# Patient Record
Sex: Male | Born: 1985 | Race: White | Hispanic: No | Marital: Married | Smoking: Current every day smoker
Health system: Southern US, Community
[De-identification: ages and names within clinical notes are randomized; demographics above are authoritative.]

## PROBLEM LIST (undated history)

## (undated) DIAGNOSIS — G510 Bell's palsy: Secondary | ICD-10-CM

## (undated) HISTORY — PX: TONSILLECTOMY: SUR1361

## (undated) HISTORY — PX: PROSTATE SURGERY: SHX751

## (undated) HISTORY — PX: HAND SURGERY: SHX662

## (undated) HISTORY — DX: Bell's palsy: G51.0

---

## 2005-05-12 ENCOUNTER — Emergency Department: Payer: Self-pay | Admitting: Emergency Medicine

## 2005-11-07 ENCOUNTER — Emergency Department (HOSPITAL_COMMUNITY): Admission: EM | Admit: 2005-11-07 | Discharge: 2005-11-08 | Payer: Self-pay | Admitting: Emergency Medicine

## 2006-01-03 ENCOUNTER — Emergency Department (HOSPITAL_COMMUNITY): Admission: EM | Admit: 2006-01-03 | Discharge: 2006-01-03 | Payer: Self-pay | Admitting: Emergency Medicine

## 2010-07-18 ENCOUNTER — Emergency Department (HOSPITAL_COMMUNITY): Admission: EM | Admit: 2010-07-18 | Discharge: 2010-07-19 | Payer: Self-pay | Admitting: Emergency Medicine

## 2010-11-09 ENCOUNTER — Emergency Department (HOSPITAL_COMMUNITY)
Admission: EM | Admit: 2010-11-09 | Discharge: 2010-11-10 | Payer: Self-pay | Source: Home / Self Care | Admitting: Emergency Medicine

## 2010-11-13 LAB — COMPREHENSIVE METABOLIC PANEL
Alkaline Phosphatase: 106 U/L (ref 39–117)
BUN: 10 mg/dL (ref 6–23)
CO2: 27 mEq/L (ref 19–32)
Calcium: 8.9 mg/dL (ref 8.4–10.5)
GFR calc non Af Amer: >60 mL/min (ref >60)
Glucose, Bld: 106 mg/dL — ABNORMAL HIGH (ref 70–99)
Total Protein: 7.2 g/dL (ref 6.0–8.3)

## 2010-11-13 LAB — DIFFERENTIAL
Basophils Absolute: 0 10*3/uL (ref 0.0–0.1)
Basophils Relative: 0 % (ref 0–1)
Eosinophils Absolute: 0.3 10*3/uL (ref 0.0–0.7)
Eosinophils Relative: 4 % (ref 0–5)
Lymphs Abs: 3.3 10*3/uL (ref 0.7–4.0)
Neutrophils Relative %: 43 % (ref 43–77)

## 2010-11-13 LAB — CBC
Platelets: 285 10*3/uL (ref 150–400)
RBC: 4.94 MIL/uL (ref 4.22–5.81)
RDW: 13.6 % (ref 11.5–15.5)
WBC: 7.7 10*3/uL (ref 4.0–10.5)

## 2010-11-13 LAB — POCT CARDIAC MARKERS
CKMB, poc: <1 ng/mL — ABNORMAL LOW (ref 1.0–8.0)
Myoglobin, poc: 40.5 ng/mL (ref 12–200)
Troponin i, poc: <0.05 ng/mL (ref 0.00–0.09)

## 2010-11-13 LAB — LIPASE, BLOOD: Lipase: 25 U/L (ref 11–59)

## 2012-04-07 ENCOUNTER — Emergency Department: Payer: Self-pay | Admitting: Emergency Medicine

## 2012-04-10 ENCOUNTER — Emergency Department: Payer: Self-pay | Admitting: Emergency Medicine

## 2013-06-30 IMAGING — CR RIGHT HAND - COMPLETE 3+ VIEW
1 series · 3 of 3 positions shown · non-contrast
Comparison: none

REASON FOR EXAM: injury
COMMENTS:   LMP: (Male)

PROCEDURE:     DXR - DXR HAND RT COMPLETE W/OBLIQUES  - April 07, 2012  [DATE]
RESULT:     Comparison:  None

[Series 1: pa · 0.17mm/px · 3 of 3 slices shown]
[im 1/3]
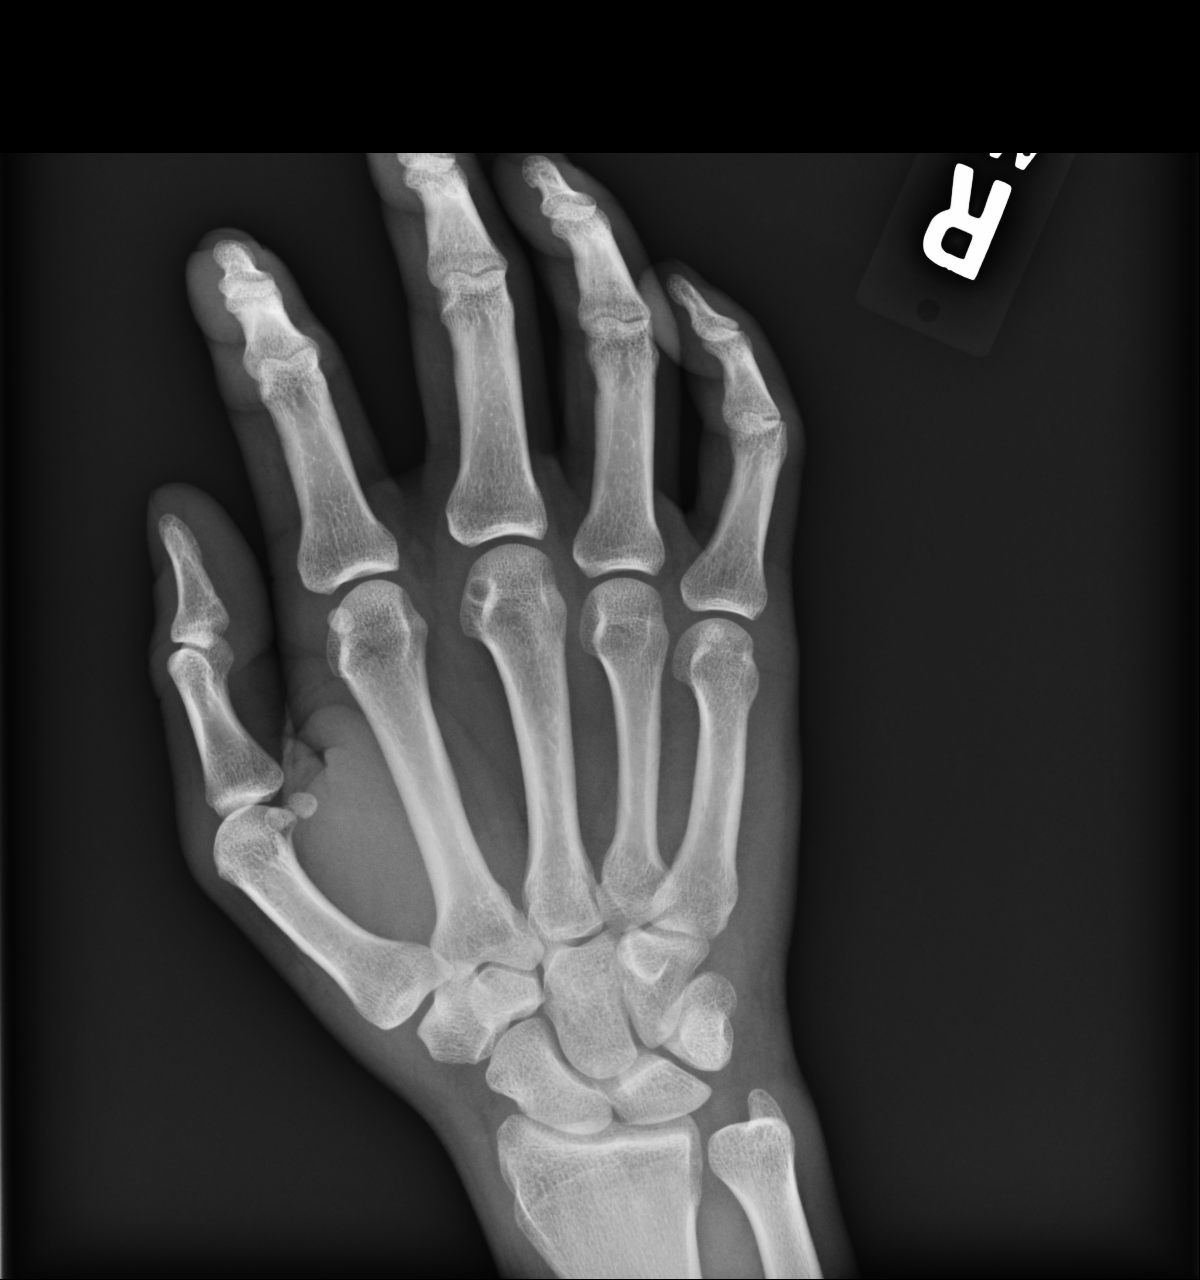
[im 2/3]
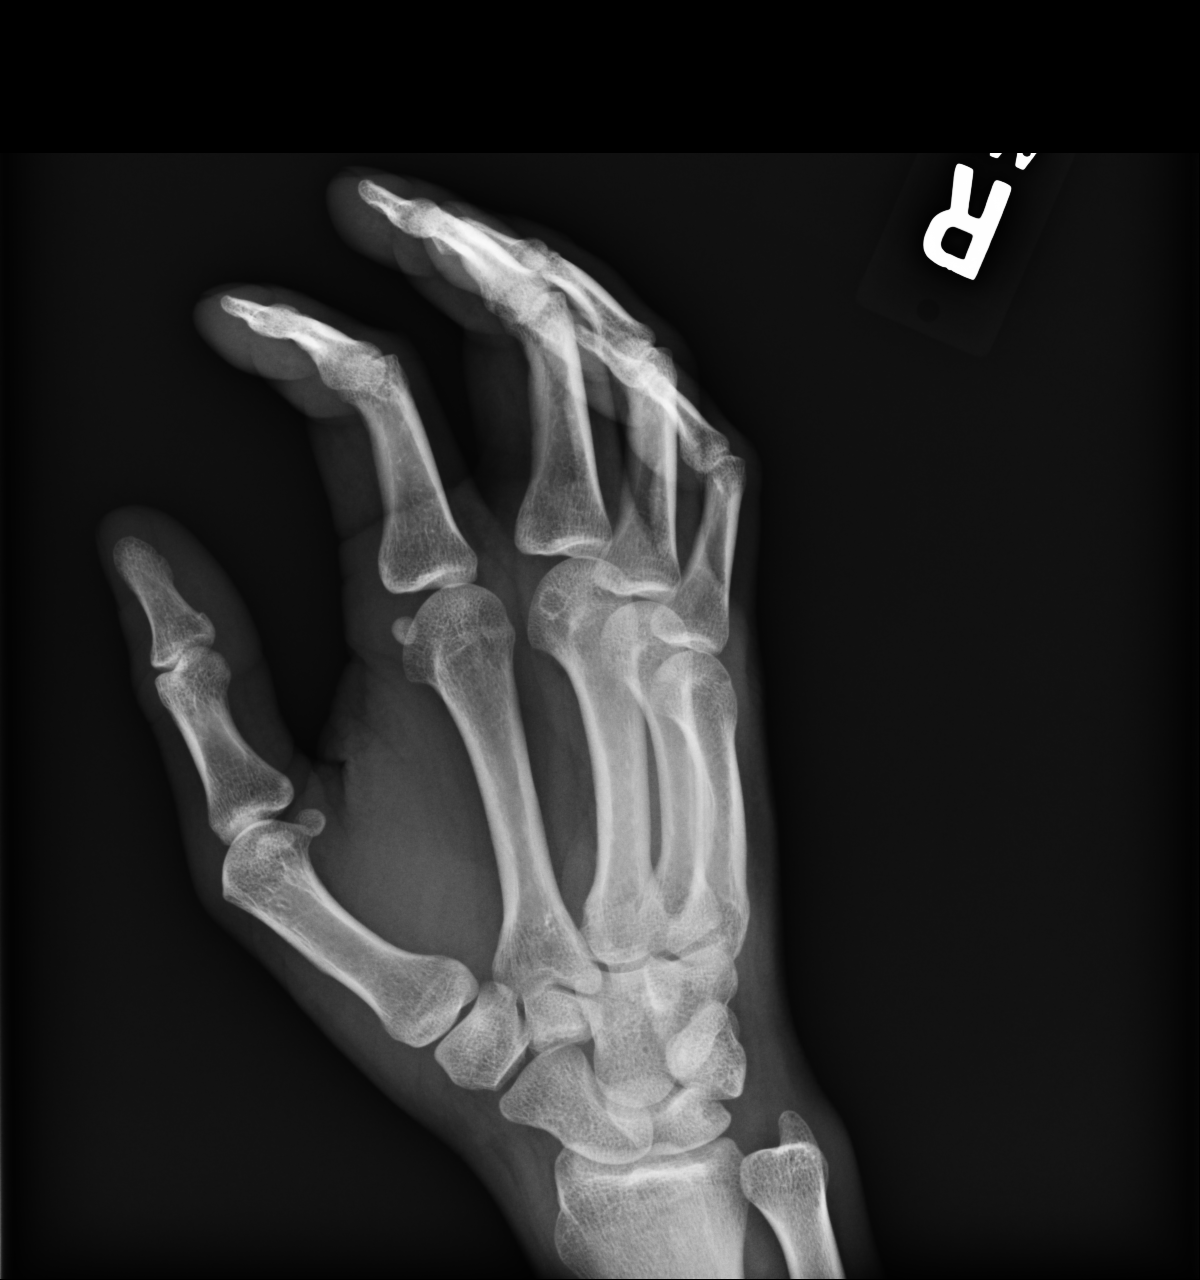
[im 3/3]
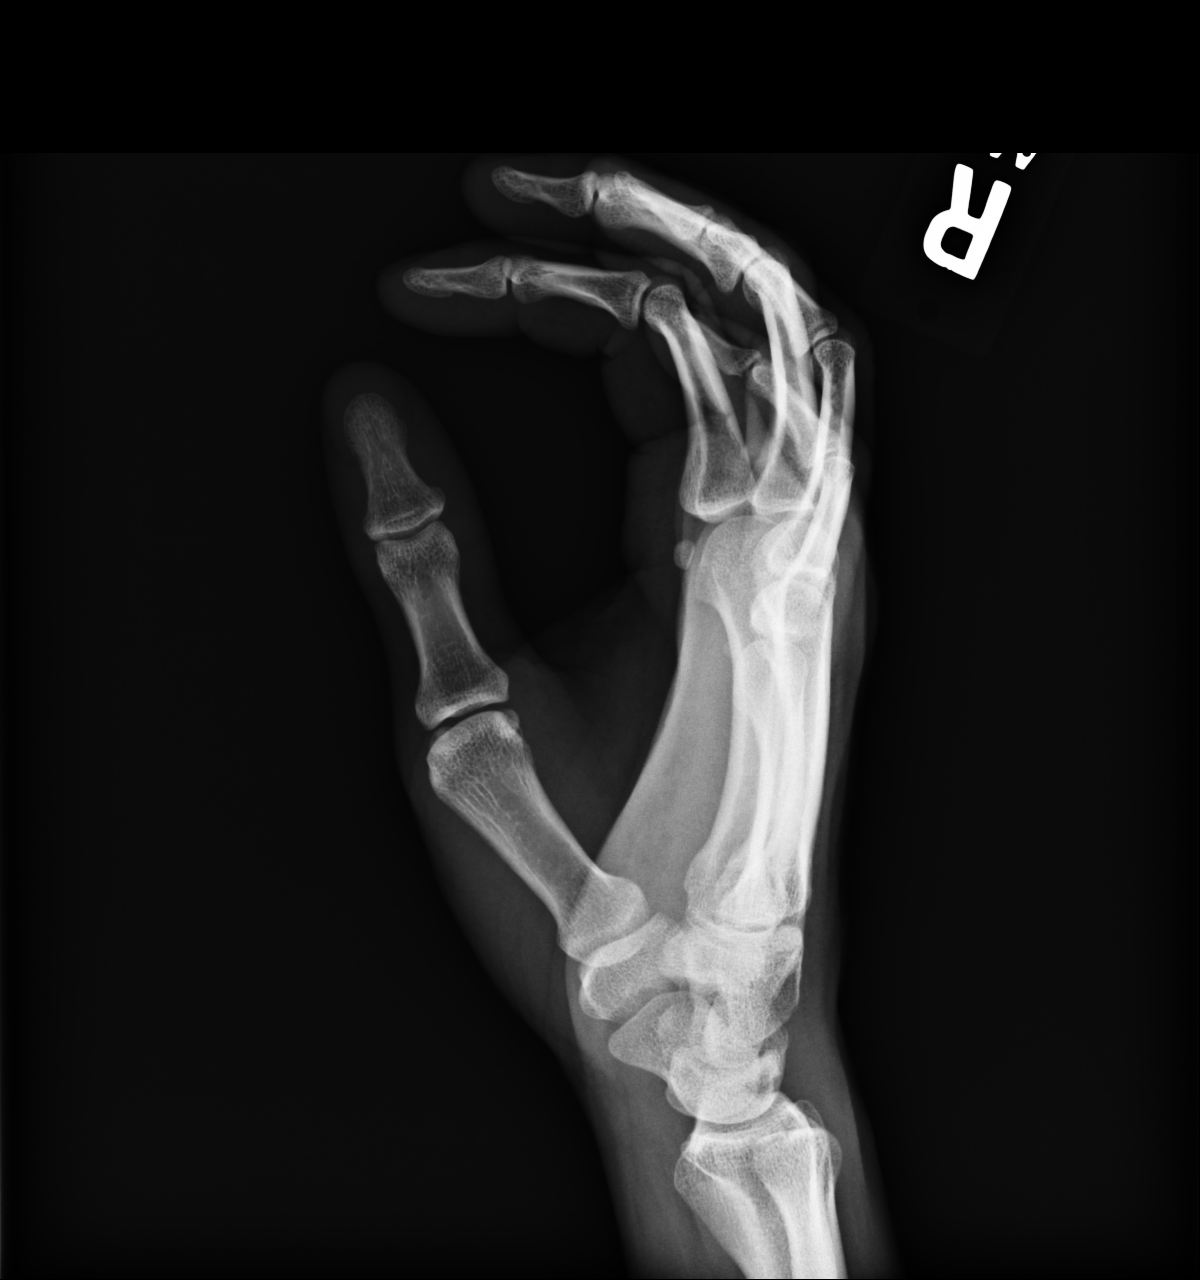

[3 of 3 positions shown; findings below may reference images not displayed]

FINDINGS: AP, oblique, and lateral views of the right hand demonstrates no fracture or
dislocation. There is normal bone mineralization. There are no erosive
changes. The joint spaces are maintained. There is no soft tissue swelling.
IMPRESSION: No acute osseous abnormality of the right hand.

[REDACTED]

## 2013-06-30 IMAGING — CR DG SHOULDER 3+V*R*
1 series · 3 of 3 positions shown · non-contrast
Comparison: none

REASON FOR EXAM: injury
COMMENTS:   LMP: (Male)

PROCEDURE:     DXR - DXR SHOULDER RIGHT COMPLETE  - April 07, 2012  [DATE]
RESULT:     Comparison: None

[Series 1: internal rotate · 0.17mm/px · 3 of 3 slices shown]
[im 1/3]
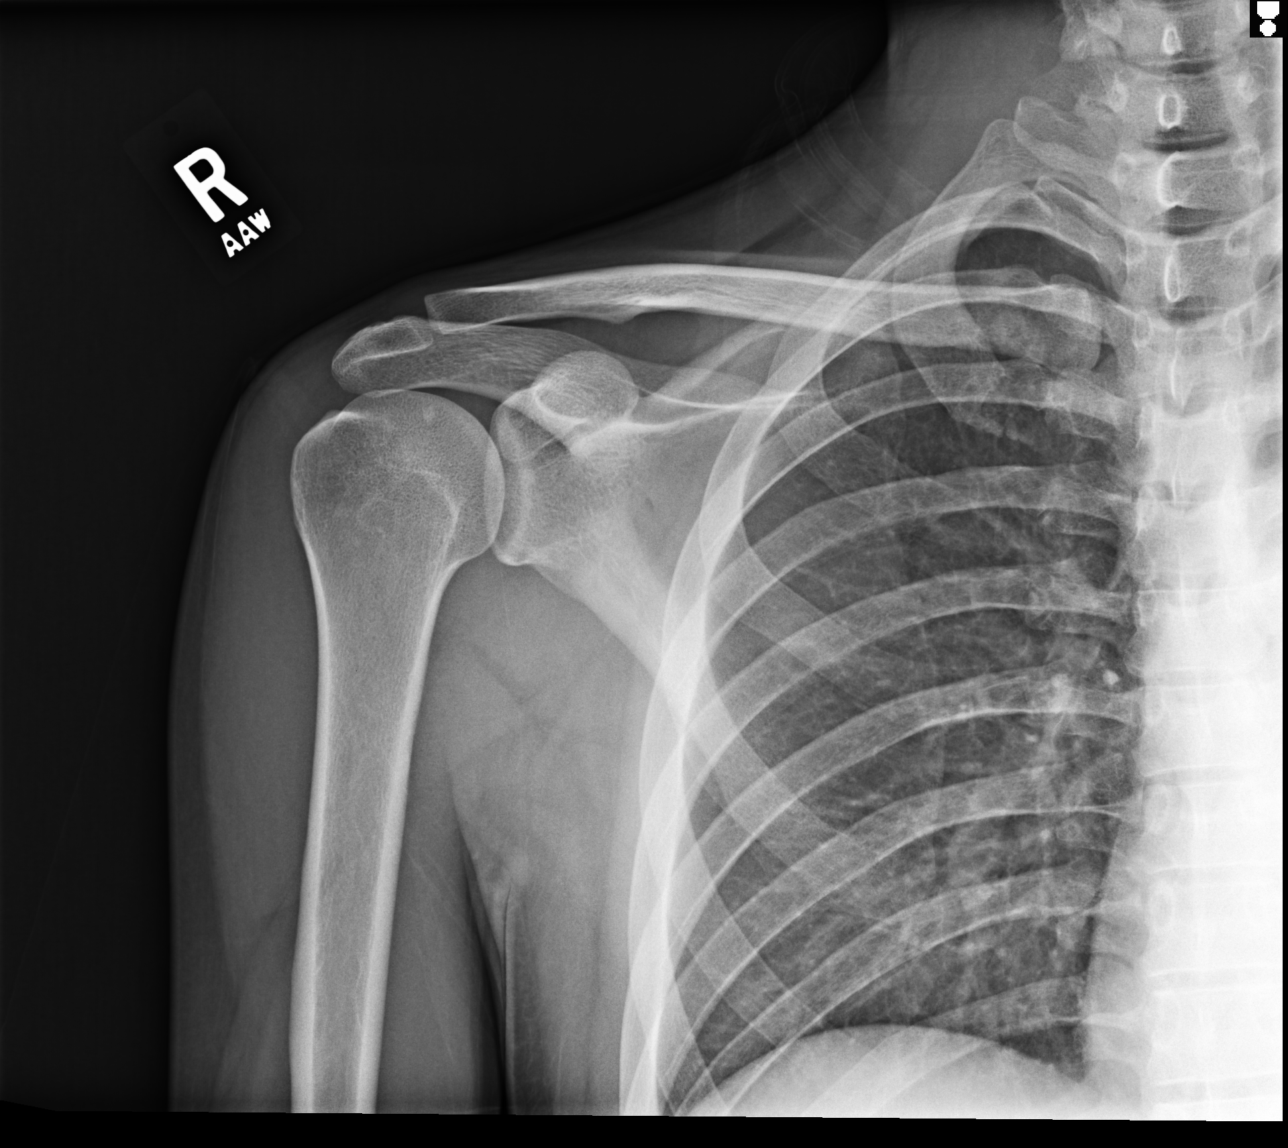
[im 2/3]
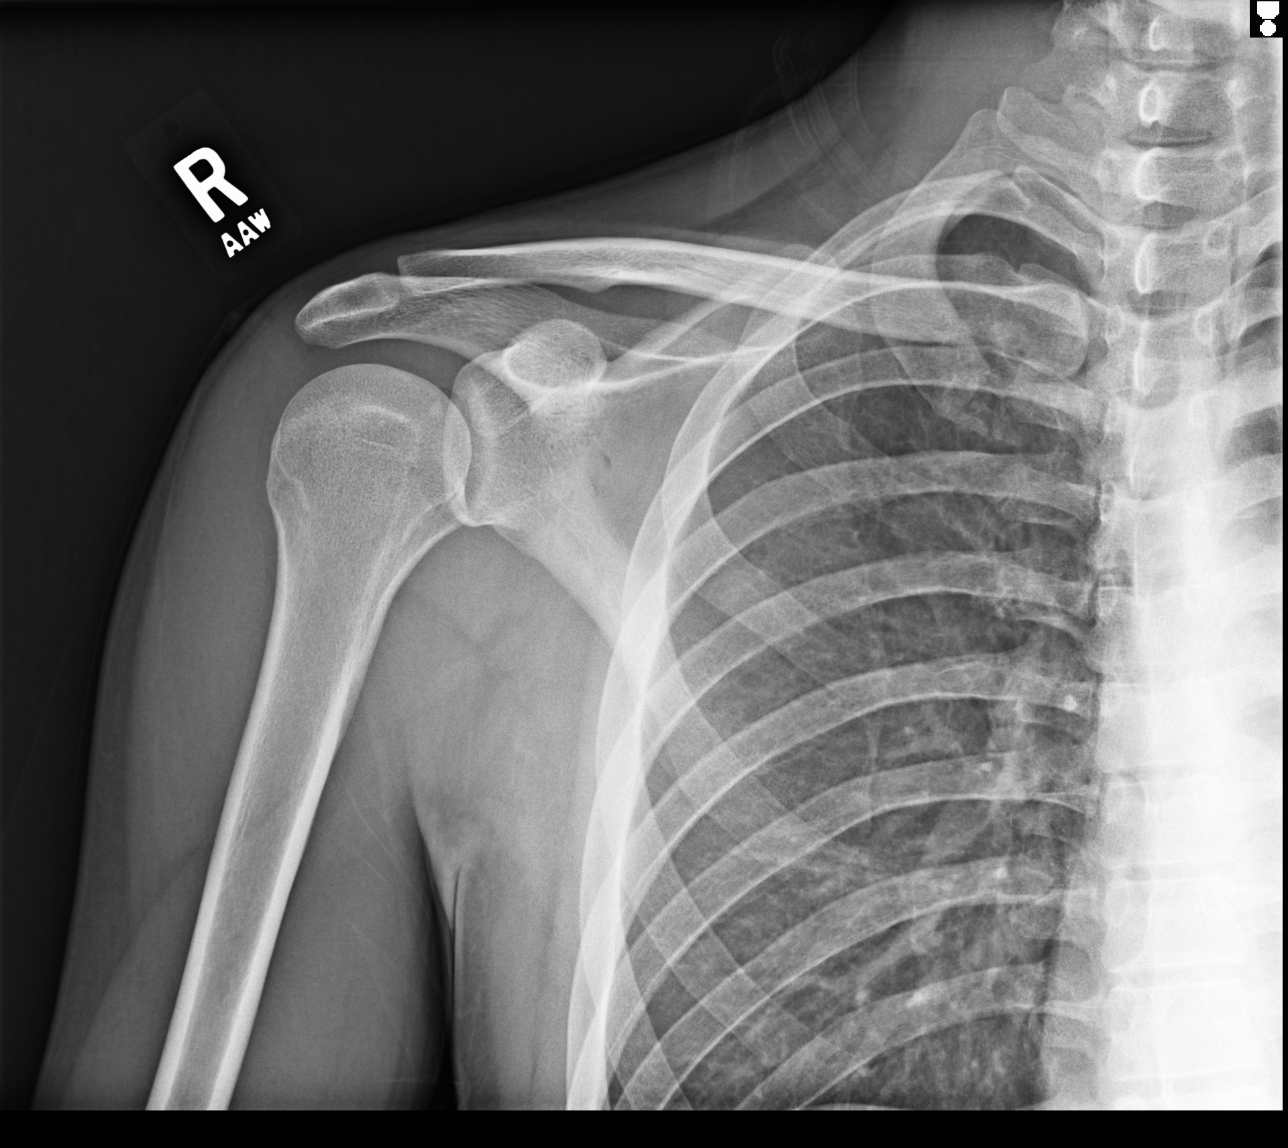
[im 3/3]
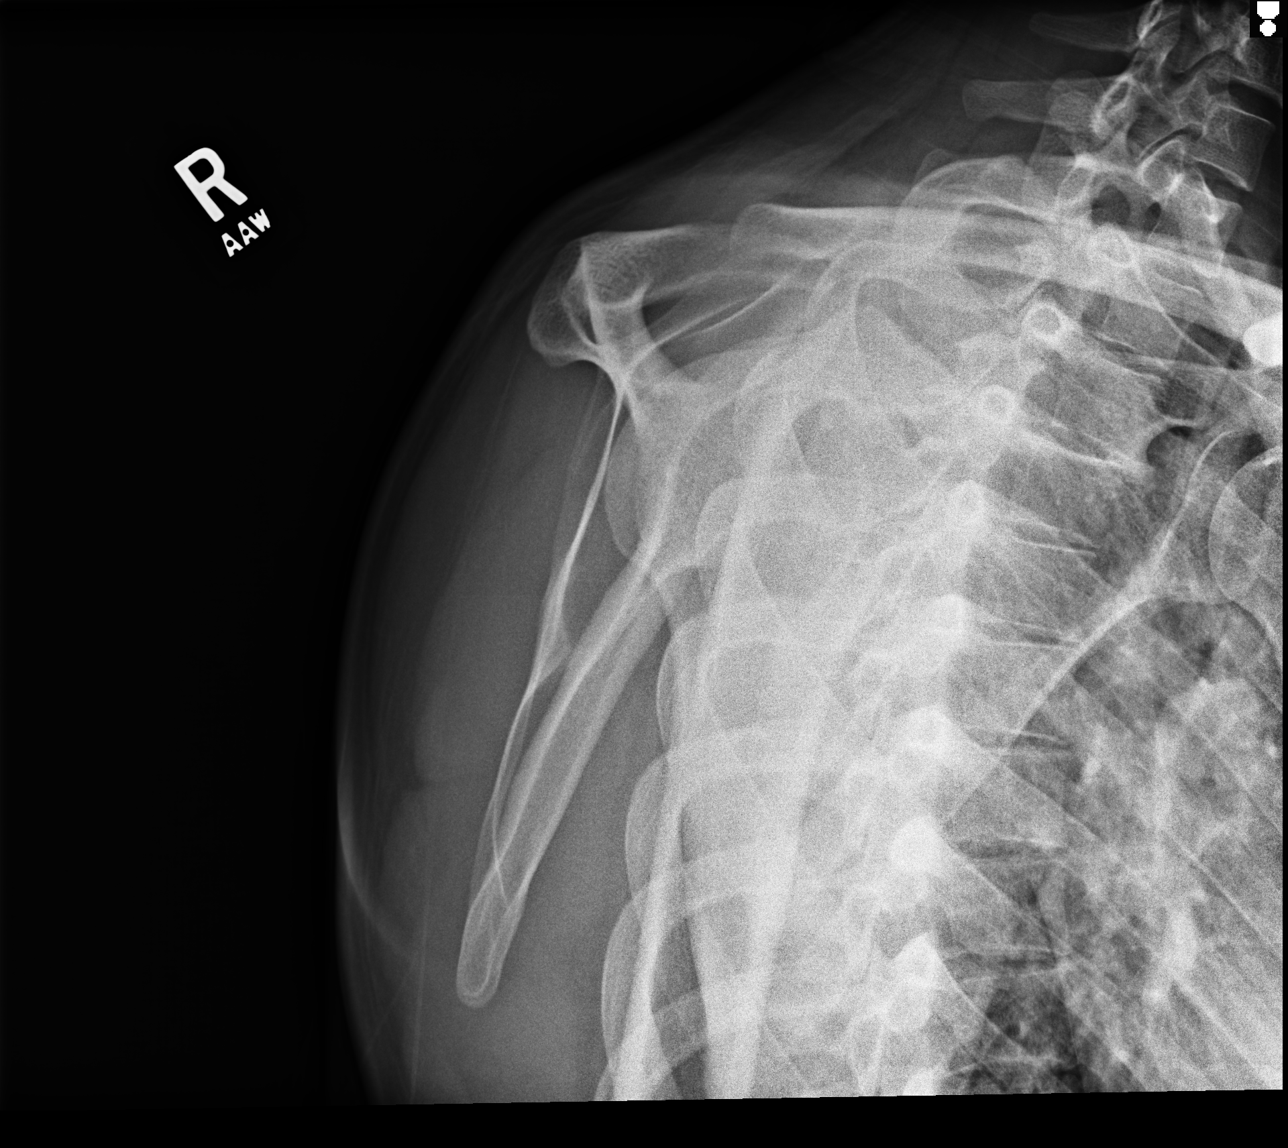

[3 of 3 positions shown; findings below may reference images not displayed]

FINDINGS: 3 views of the right shoulder demonstrates no fracture or dislocation. The
acromioclavicular joint is normal.
IMPRESSION: No acute osseous injury of the right shoulder.

## 2022-10-01 ENCOUNTER — Encounter: Payer: Self-pay | Admitting: Internal Medicine

## 2022-10-01 ENCOUNTER — Ambulatory Visit: Payer: Medicaid Other | Admitting: Internal Medicine

## 2022-10-01 VITALS — BP 148/88 | HR 94 | Temp 97.9°F | Resp 16 | Ht 66.0 in | Wt 202.0 lb

## 2022-10-01 DIAGNOSIS — I10 Essential (primary) hypertension: Secondary | ICD-10-CM

## 2022-10-01 MED ORDER — AMLODIPINE BESYLATE 2.5 MG PO TABS: 2.5000 mg | ORAL_TABLET | Freq: Every day | ORAL | 1 refills | Status: DC

## 2022-10-01 NOTE — Progress Notes (Signed)
Office Visit  Subjective   Patient ID: Lance Weaver   DOB: [birth year]   Age: 36 y.o.   MRN: [id number]   Chief Complaint Chief Complaint  Patient presents with   Acute Visit    Elevated BP     History of Present Illness The patient states he had problems with his BP a few weeks ago where he had some headache and blurred vision.  He checked his BP at that time 154/110.  He has been checking his BP at home since then where his BP is running 140-160's.  He states it gets high when he has anxiety or stress.  He has never had a diagnosis of HTN.  [facility] did go [facility] at [facility] end of 08/2022 with BP problems and they did a CT scan of his head that showed lesions.  He has an appointment with neurology coming up in 10/2021.  Today, he denies any chest pain, dizziness, vision changes, SOB, weakness/numbness or other problems.  He does smoke about 1/2 ppd where he started smoking at age 57 where he has smoked for about 22 years.  I have reviewed a copy of the CT of his head done on 09/11/2022 and this showed no evidence of acute stroke but he had mild patchy hypodensities in the cerebral white matter bilaterally.       Past Medical History Past Medical History:  Diagnosis Date   Bell's palsy      Allergies Allergies  Allergen Reactions   Bactrim [Sulfamethoxazole-Trimethoprim]    Erythromycin    Gabapentin    Keflex [Cephalexin]    Penicillins    Tramadol      Review of Systems Review of Systems  Constitutional:  Negative for chills and fever.  HENT:  Negative for hearing loss.   Eyes:  Negative for blurred vision and double vision.  Respiratory:  Negative for cough and shortness of breath.   Cardiovascular:  Negative for chest pain, palpitations and leg swelling.  Gastrointestinal:  Negative for constipation, diarrhea, nausea and vomiting.  Musculoskeletal:  Negative for myalgias.  Skin:  Negative for itching and rash.  Neurological:  Positive for headaches. Negative for  dizziness, focal weakness and weakness.       Objective:    Vitals BP (!) 148/88   Pulse 94   Temp 97.9 F (36.6 C)   Resp 16   Ht 5\' 6"  (1.676 m)   Wt 202 lb (91.6 kg)   SpO2 98%   BMI 32.60 kg/m    Physical Examination Physical Exam Constitutional:      Appearance: Normal appearance. He is not ill-appearing.  Cardiovascular:     Rate and Rhythm: Normal rate and regular rhythm.     Pulses: Normal pulses.     Heart sounds: Normal heart sounds. No murmur heard.    No friction rub. No gallop.  Pulmonary:     Effort: Pulmonary effort is normal. No respiratory distress.     Breath sounds: No wheezing, rhonchi or rales.  Abdominal:     General: Abdomen is flat. Bowel sounds are normal. There is no distension.     Palpations: Abdomen is soft.     Tenderness: There is no abdominal tenderness.  Musculoskeletal:     Right lower leg: No edema.     Left lower leg: No edema.  Skin:    General: Skin is warm and dry.     Findings: No rash.  Neurological:     General: No  focal deficit present.     Mental Status: He is alert and oriented to person, place, and time.        Assessment & Plan:   Essential hypertension His BP is elevated and I am going to start him on low dose amlodipine 2.5mg  daily.  I am going to have him come back in 2-3 weeks for a nursing BP check.  However, I want him to come back tomorrow per his request for yearly exam.  He is being referred for these lesions seen on the MRI of his brain.    No follow-ups on file.   Crist Fat, MD

## 2022-10-01 NOTE — Assessment & Plan Note (Signed)
His BP is elevated and I am going to start him on low dose amlodipine 2.5mg  daily.  I am going to have him come back in 2-3 weeks for a nursing BP check.  However, I want him to come back tomorrow per his request for yearly exam.  He is being referred for these lesions seen on the MRI of his brain.

## 2022-10-02 ENCOUNTER — Other Ambulatory Visit: Payer: Self-pay

## 2022-10-02 ENCOUNTER — Encounter: Payer: Medicaid Other | Admitting: Internal Medicine

## 2022-10-02 MED ORDER — AMLODIPINE BESYLATE 2.5 MG PO TABS: 2.5000 mg | ORAL_TABLET | Freq: Every day | ORAL | 1 refills | Status: AC

## 2022-10-16 ENCOUNTER — Ambulatory Visit: Payer: Medicaid Other

## 2023-01-02 ENCOUNTER — Ambulatory Visit: Payer: Medicaid Other | Admitting: Internal Medicine

## 2023-03-23 ENCOUNTER — Emergency Department (HOSPITAL_COMMUNITY): Payer: Medicaid Other

## 2023-03-23 ENCOUNTER — Encounter (HOSPITAL_COMMUNITY): Payer: Self-pay | Admitting: *Deleted

## 2023-03-23 ENCOUNTER — Emergency Department (HOSPITAL_COMMUNITY)
Admission: EM | Admit: 2023-03-23 | Discharge: 2023-03-23 | Disposition: A | Discharge status: Home / Self Care | Payer: Medicaid Other | Attending: Emergency Medicine | Admitting: Emergency Medicine

## 2023-03-23 ENCOUNTER — Other Ambulatory Visit: Payer: Self-pay

## 2023-03-23 DIAGNOSIS — Y9289 Other specified places as the place of occurrence of the external cause: Secondary | ICD-10-CM | POA: Diagnosis not present

## 2023-03-23 DIAGNOSIS — S060X0A Concussion without loss of consciousness, initial encounter: Secondary | ICD-10-CM

## 2023-03-23 DIAGNOSIS — I10 Essential (primary) hypertension: Secondary | ICD-10-CM | POA: Diagnosis not present

## 2023-03-23 DIAGNOSIS — S39012A Strain of muscle, fascia and tendon of lower back, initial encounter: Secondary | ICD-10-CM | POA: Diagnosis not present

## 2023-03-23 DIAGNOSIS — Z79899 Other long term (current) drug therapy: Secondary | ICD-10-CM | POA: Diagnosis not present

## 2023-03-23 DIAGNOSIS — S3991XA Unspecified injury of abdomen, initial encounter: Secondary | ICD-10-CM | POA: Insufficient documentation

## 2023-03-23 DIAGNOSIS — S298XXA Other specified injuries of thorax, initial encounter: Secondary | ICD-10-CM

## 2023-03-23 DIAGNOSIS — S161XXA Strain of muscle, fascia and tendon at neck level, initial encounter: Secondary | ICD-10-CM | POA: Diagnosis not present

## 2023-03-23 DIAGNOSIS — S299XXA Unspecified injury of thorax, initial encounter: Secondary | ICD-10-CM | POA: Diagnosis not present

## 2023-03-23 DIAGNOSIS — R519 Headache, unspecified: Secondary | ICD-10-CM | POA: Diagnosis present

## 2023-03-23 LAB — CBC
HCT: 43.8 % (ref 39.0–52.0)
Hemoglobin: 15.4 g/dL (ref 13.0–17.0)
MCH: 33 pg (ref 26.0–34.0)
MCHC: 35.2 g/dL (ref 30.0–36.0)
MCV: 93.8 fL (ref 80.0–100.0)
Platelets: 259 10*3/uL (ref 150–400)
RBC: 4.67 MIL/uL (ref 4.22–5.81)
RDW: 13.3 % (ref 11.5–15.5)
WBC: 8.5 10*3/uL (ref 4.0–10.5)
nRBC: 0 % (ref 0.0–0.2)

## 2023-03-23 LAB — LACTIC ACID, PLASMA: Lactic Acid, Venous: 0.8 mmol/L (ref 0.5–1.9)

## 2023-03-23 LAB — COMPREHENSIVE METABOLIC PANEL
ALT: 37 U/L (ref 0–44)
AST: 25 U/L (ref 15–41)
Albumin: 4 g/dL (ref 3.5–5.0)
Alkaline Phosphatase: 78 U/L (ref 38–126)
Anion gap: 8 (ref 5–15)
BUN: 10 mg/dL (ref 6–20)
CO2: 24 mmol/L (ref 22–32)
Calcium: 8.4 mg/dL — ABNORMAL LOW (ref 8.9–10.3)
Chloride: 106 mmol/L (ref 98–111)
Creatinine, Ser: 1.02 mg/dL (ref 0.61–1.24)
GFR, Estimated: >60 mL/min (ref >60)
Glucose, Bld: 94 mg/dL (ref 70–99)
Potassium: 3.5 mmol/L (ref 3.5–5.1)
Sodium: 138 mmol/L (ref 135–145)
Total Bilirubin: 0.5 mg/dL (ref 0.3–1.2)
Total Protein: 6.9 g/dL (ref 6.5–8.1)

## 2023-03-23 LAB — PROTIME-INR
INR: 1 (ref 0.8–1.2)
Prothrombin Time: 13 seconds (ref 11.4–15.2)

## 2023-03-23 LAB — I-STAT CHEM 8, ED
BUN: 12 mg/dL (ref 6–20)
Calcium, Ion: 1.06 mmol/L — ABNORMAL LOW (ref 1.15–1.40)
Chloride: 104 mmol/L (ref 98–111)
Creatinine, Ser: 0.9 mg/dL (ref 0.61–1.24)
Glucose, Bld: 95 mg/dL (ref 70–99)
HCT: 44 % (ref 39.0–52.0)
Hemoglobin: 15 g/dL (ref 13.0–17.0)
Potassium: 3.7 mmol/L (ref 3.5–5.1)
Sodium: 140 mmol/L (ref 135–145)
TCO2: 25 mmol/L (ref 22–32)

## 2023-03-23 LAB — SAMPLE TO BLOOD BANK

## 2023-03-23 LAB — ETHANOL: Alcohol, Ethyl (B): <10 mg/dL (ref <10)

## 2023-03-23 MED ORDER — FENTANYL CITRATE PF 50 MCG/ML IJ SOSY
100.0000 ug | PREFILLED_SYRINGE | Freq: Once | INTRAMUSCULAR | Status: AC
  Administered 2023-03-23: 100 ug via INTRAVENOUS
  Filled 2023-03-23: qty 2

## 2023-03-23 MED ORDER — KETOROLAC TROMETHAMINE 15 MG/ML IJ SOLN
15.0000 mg | Freq: Once | INTRAMUSCULAR | Status: AC
  Administered 2023-03-23: 15 mg via INTRAVENOUS
  Filled 2023-03-23: qty 1

## 2023-03-23 MED ORDER — IOHEXOL 350 MG/ML SOLN
75.0000 mL | Freq: Once | INTRAVENOUS | Status: AC | PRN
  Administered 2023-03-23: 75 mL via INTRAVENOUS

## 2023-03-23 MED ORDER — ONDANSETRON HCL 4 MG/2ML IJ SOLN
4.0000 mg | Freq: Once | INTRAMUSCULAR | Status: AC
  Administered 2023-03-23: 4 mg via INTRAVENOUS
  Filled 2023-03-23: qty 2

## 2023-03-23 MED ORDER — FENTANYL CITRATE PF 50 MCG/ML IJ SOSY
50.0000 ug | PREFILLED_SYRINGE | Freq: Once | INTRAMUSCULAR | Status: AC
  Administered 2023-03-23: 50 ug via INTRAVENOUS
  Filled 2023-03-23: qty 1
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
## 2023-03-23 NOTE — ED Notes (Signed)
The pt walked down the hall without any difficulty except for the back pain
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
## 2023-03-23 NOTE — Progress Notes (Signed)
Orthopedic Tech Progress Note Patient Details:  Lance Weaver Oct 24, 1985 [id number]  Patient ID: Lance Weaver, male   DOB: [birth year], 37 y.o.   MRN: [id number] I attended trauma page. Trinna Post 03/23/2023, 3:44 AM
